# Patient Record
Sex: Male | Born: 1955 | Race: Black or African American | Hispanic: No | Marital: Single | Smoking: Never smoker
Health system: Southern US, Community
[De-identification: ages and names within clinical notes are randomized; demographics above are authoritative.]

## PROBLEM LIST (undated history)

## (undated) DIAGNOSIS — E119 Type 2 diabetes mellitus without complications: Secondary | ICD-10-CM

## (undated) HISTORY — PX: BACK SURGERY: SHX140

---

## 1999-03-13 ENCOUNTER — Emergency Department (HOSPITAL_COMMUNITY): Admission: EM | Admit: 1999-03-13 | Discharge: 1999-03-13 | Payer: Self-pay | Admitting: Emergency Medicine

## 1999-03-13 ENCOUNTER — Encounter: Payer: Self-pay | Admitting: Emergency Medicine

## 1999-05-16 ENCOUNTER — Encounter: Payer: Self-pay | Admitting: Emergency Medicine

## 1999-05-16 ENCOUNTER — Emergency Department (HOSPITAL_COMMUNITY): Admission: EM | Admit: 1999-05-16 | Discharge: 1999-05-16 | Payer: Self-pay | Admitting: Emergency Medicine

## 2000-06-12 ENCOUNTER — Emergency Department (HOSPITAL_COMMUNITY): Admission: EM | Admit: 2000-06-12 | Discharge: 2000-06-12 | Payer: Self-pay | Admitting: Emergency Medicine

## 2000-06-12 ENCOUNTER — Encounter: Payer: Self-pay | Admitting: Emergency Medicine

## 2012-02-05 ENCOUNTER — Encounter (HOSPITAL_COMMUNITY): Payer: Self-pay | Admitting: *Deleted

## 2012-02-05 ENCOUNTER — Emergency Department (HOSPITAL_COMMUNITY)
Admission: EM | Admit: 2012-02-05 | Discharge: 2012-02-06 | Disposition: A | Discharge status: Home / Self Care | Payer: No Typology Code available for payment source | Attending: Emergency Medicine | Admitting: Emergency Medicine

## 2012-02-05 DIAGNOSIS — M545 Low back pain, unspecified: Secondary | ICD-10-CM | POA: Insufficient documentation

## 2012-02-05 DIAGNOSIS — T1490XA Injury, unspecified, initial encounter: Secondary | ICD-10-CM | POA: Insufficient documentation

## 2012-02-05 DIAGNOSIS — M542 Cervicalgia: Secondary | ICD-10-CM | POA: Insufficient documentation

## 2012-02-05 DIAGNOSIS — M549 Dorsalgia, unspecified: Secondary | ICD-10-CM | POA: Insufficient documentation

## 2012-02-05 DIAGNOSIS — Y92410 Unspecified street and highway as the place of occurrence of the external cause: Secondary | ICD-10-CM | POA: Insufficient documentation
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
## 2012-02-06 ENCOUNTER — Emergency Department (HOSPITAL_COMMUNITY): Payer: No Typology Code available for payment source

## 2012-02-06 ENCOUNTER — Encounter (HOSPITAL_COMMUNITY): Payer: Self-pay | Admitting: Emergency Medicine

## 2012-02-06 MED ORDER — HYDROCODONE-ACETAMINOPHEN 5-325 MG PO TABS: 2.0000 | ORAL_TABLET | ORAL | Status: AC | PRN

## 2012-02-06 MED ORDER — IBUPROFEN 800 MG PO TABS
800.0000 mg | ORAL_TABLET | Freq: Once | ORAL | Status: AC
  Administered 2012-02-06: 800 mg via ORAL

## 2012-02-06 MED ORDER — IBUPROFEN 800 MG PO TABS
ORAL_TABLET | ORAL | Status: AC
  Filled 2012-02-06: qty 1

## 2012-02-06 MED ORDER — DIAZEPAM 5 MG PO TABS: 5.0000 mg | ORAL_TABLET | Freq: Two times a day (BID) | ORAL | Status: AC
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
## 2012-02-06 NOTE — ED Notes (Signed)
Patient is alert and oriented x3.  He was given DC instructions and follow up visit instructions.  Patient gave verbal understanding.  He was DC ambulatory under his own power to home.  V/S stable.  He was not showing any signs of distress on DC 
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
## 2012-02-09 NOTE — ED Provider Notes (Signed)
Medical screening examination/treatment/procedure(s) were performed by non-physician practitioner and as supervising physician I was immediately available for consultation/collaboration.  Kimmerly Lora K Linker, MD 02/09/12 0656 

## 2014-08-04 DIAGNOSIS — S79912A Unspecified injury of left hip, initial encounter: Secondary | ICD-10-CM | POA: Diagnosis not present

## 2014-08-04 DIAGNOSIS — Y9389 Activity, other specified: Secondary | ICD-10-CM | POA: Insufficient documentation

## 2014-08-04 DIAGNOSIS — E119 Type 2 diabetes mellitus without complications: Secondary | ICD-10-CM | POA: Diagnosis not present

## 2014-08-04 DIAGNOSIS — Y92410 Unspecified street and highway as the place of occurrence of the external cause: Secondary | ICD-10-CM | POA: Insufficient documentation

## 2014-08-05 ENCOUNTER — Emergency Department (HOSPITAL_COMMUNITY)
Admission: EM | Admit: 2014-08-05 | Discharge: 2014-08-05 | Disposition: A | Discharge status: Home / Self Care | Payer: No Typology Code available for payment source | Attending: Emergency Medicine | Admitting: Emergency Medicine

## 2014-08-05 ENCOUNTER — Encounter (HOSPITAL_COMMUNITY): Payer: Self-pay | Admitting: Emergency Medicine

## 2014-08-05 ENCOUNTER — Emergency Department (HOSPITAL_COMMUNITY): Payer: No Typology Code available for payment source

## 2014-08-05 DIAGNOSIS — M25552 Pain in left hip: Secondary | ICD-10-CM

## 2014-08-05 HISTORY — DX: Type 2 diabetes mellitus without complications: E11.9

## 2014-08-05 MED ORDER — CYCLOBENZAPRINE HCL 10 MG PO TABS: 10.0000 mg | ORAL_TABLET | Freq: Two times a day (BID) | ORAL | Status: AC | PRN

## 2014-08-05 MED ORDER — OXYCODONE-ACETAMINOPHEN 5-325 MG PO TABS
1.0000 | ORAL_TABLET | Freq: Once | ORAL | Status: AC
  Administered 2014-08-05: 1 via ORAL
  Filled 2014-08-05: qty 1

## 2014-08-05 MED ORDER — NAPROXEN 375 MG PO TABS: 375.0000 mg | ORAL_TABLET | Freq: Two times a day (BID) | ORAL | Status: AC

## 2014-08-05 MED ORDER — OXYCODONE-ACETAMINOPHEN 5-325 MG PO TABS: 1.0000 | ORAL_TABLET | Freq: Four times a day (QID) | ORAL | Status: DC | PRN

## 2014-08-05 MED ORDER — ONDANSETRON 4 MG PO TBDP
4.0000 mg | ORAL_TABLET | Freq: Once | ORAL | Status: AC
  Administered 2014-08-05: 4 mg via ORAL
  Filled 2014-08-05: qty 1
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
## 2014-08-05 NOTE — ED Notes (Signed)
Pt reports his friends will drive him home

## 2014-08-08 NOTE — ED Provider Notes (Signed)
Medical screening examination/treatment/procedure(s) were performed by non-physician practitioner and as supervising physician I was immediately available for consultation/collaboration.   EKG Interpretation None       Olivia Mackielga M Kody Brandl, MD 08/08/14 1504
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
## 2024-01-19 ENCOUNTER — Encounter: Payer: Self-pay | Admitting: *Deleted

## 2024-06-11 ENCOUNTER — Emergency Department (HOSPITAL_COMMUNITY)
Admission: EM | Admit: 2024-06-11 | Discharge: 2024-06-12 | Disposition: A | Discharge status: Home / Self Care | Attending: Emergency Medicine | Admitting: Emergency Medicine

## 2024-06-11 ENCOUNTER — Other Ambulatory Visit: Payer: Self-pay

## 2024-06-11 ENCOUNTER — Encounter (HOSPITAL_COMMUNITY): Payer: Self-pay | Admitting: Emergency Medicine

## 2024-06-11 DIAGNOSIS — R42 Dizziness and giddiness: Secondary | ICD-10-CM | POA: Diagnosis present

## 2024-06-11 NOTE — ED Triage Notes (Signed)
 Pt states he stood up and became dizzy and fell back on the bed.

## 2024-06-12 ENCOUNTER — Emergency Department (HOSPITAL_COMMUNITY)

## 2024-06-12 DIAGNOSIS — R42 Dizziness and giddiness: Secondary | ICD-10-CM | POA: Diagnosis not present

## 2024-06-12 LAB — BASIC METABOLIC PANEL WITH GFR
Anion gap: 9 (ref 5–15)
BUN: 15 mg/dL (ref 8–23)
CO2: 23 mmol/L (ref 22–32)
Calcium: 9.3 mg/dL (ref 8.9–10.3)
Chloride: 102 mmol/L (ref 98–111)
Creatinine, Ser: 0.87 mg/dL (ref 0.61–1.24)
GFR, Estimated: >60 mL/min (ref >60)
Glucose, Bld: 217 mg/dL — ABNORMAL HIGH (ref 70–99)
Potassium: 4.4 mmol/L (ref 3.5–5.1)
Sodium: 134 mmol/L — ABNORMAL LOW (ref 135–145)

## 2024-06-12 LAB — CBC WITH DIFFERENTIAL/PLATELET
Abs Immature Granulocytes: 0.03 K/uL (ref 0.00–0.07)
Basophils Absolute: 0 K/uL (ref 0.0–0.1)
Basophils Relative: 0 %
Eosinophils Absolute: 0.3 K/uL (ref 0.0–0.5)
Eosinophils Relative: 3 %
HCT: 40.4 % (ref 39.0–52.0)
Hemoglobin: 13.5 g/dL (ref 13.0–17.0)
Immature Granulocytes: 0 %
Lymphocytes Relative: 36 %
Lymphs Abs: 3.3 K/uL (ref 0.7–4.0)
MCH: 29.8 pg (ref 26.0–34.0)
MCHC: 33.4 g/dL (ref 30.0–36.0)
MCV: 89.2 fL (ref 80.0–100.0)
Monocytes Absolute: 0.8 K/uL (ref 0.1–1.0)
Monocytes Relative: 9 %
Neutro Abs: 4.6 K/uL (ref 1.7–7.7)
Neutrophils Relative %: 52 %
Platelets: 198 K/uL (ref 150–400)
RBC: 4.53 MIL/uL (ref 4.22–5.81)
RDW: 13.6 % (ref 11.5–15.5)
WBC: 9.1 K/uL (ref 4.0–10.5)
nRBC: 0 % (ref 0.0–0.2)

## 2024-06-12 LAB — URINALYSIS, ROUTINE W REFLEX MICROSCOPIC
Bilirubin Urine: NEGATIVE
Glucose, UA: 150 mg/dL — AB
Hgb urine dipstick: NEGATIVE
Ketones, ur: NEGATIVE mg/dL
Leukocytes,Ua: NEGATIVE
Nitrite: NEGATIVE
Protein, ur: NEGATIVE mg/dL
Specific Gravity, Urine: 1.014 (ref 1.005–1.030)
pH: 6 (ref 5.0–8.0)

## 2024-06-12 NOTE — ED Provider Notes (Signed)
 [place] EMERGENCY DEPARTMENT AT [facility] Provider Note   CSN: [id number] Arrival date & time: 06/11/24  2237     Patient presents with: Dizziness   Joshua Santana is a 68 y.o. male.   Presents to the emergency department for evaluation of dizziness.  Patient reports that he had an episode of dizziness after standing tonight.  Reports that it caused him to fall back into the bed.  He did not lose consciousness.  He reports that he is a diabetic, was concerned about the symptoms.  He does, however, reports that currently he is feeling back to his baseline without any further dizziness.       Prior to Admission medications   Medication Sig Start Date End Date Taking? Authorizing Provider  cyclobenzaprine  (FLEXERIL ) 10 MG tablet Take 1 tablet (10 mg total) by mouth 2 (two) times daily as needed for muscle spasms. 08/05/14   Levora Riggs, PA-C  ibuprofen  (ADVIL ,MOTRIN ) 200 MG tablet Take 200 mg by mouth every 6 (six) hours as needed. Pain    [provider]  naproxen  (NAPROSYN ) 375 MG tablet Take 1 tablet (375 mg total) by mouth 2 (two) times daily. 08/05/14   Levora Riggs, PA-C  oxyCODONE -acetaminophen  (PERCOCET/ROXICET) 5-325 MG per tablet Take 1-2 tablets by mouth every 6 (six) hours as needed. 08/05/14   Levora Riggs, PA-C    Allergies: Patient has no known allergies.    Review of Systems  Updated Vital Signs BP (!) 158/93   Pulse 70   Temp 97.8 F (36.6 C) (Oral)   Resp 17   Ht 5' 11 (1.803 m)   Wt 88.5 kg   SpO2 100%   BMI 27.21 kg/m   Physical Exam Vitals and nursing note reviewed.  Constitutional:      General: He is not in acute distress.    Appearance: He is well-developed.  HENT:     Head: Normocephalic and atraumatic.     Mouth/Throat:     Mouth: Mucous membranes are moist.  Eyes:     General: Vision grossly intact. Gaze aligned appropriately.     Extraocular Movements: Extraocular movements intact.     Conjunctiva/sclera:  Conjunctivae normal.  Cardiovascular:     Rate and Rhythm: Normal rate and regular rhythm.     Pulses: Normal pulses.     Heart sounds: Normal heart sounds, S1 normal and S2 normal. No murmur heard.    No friction rub. No gallop.  Pulmonary:     Effort: Pulmonary effort is normal. No respiratory distress.     Breath sounds: Normal breath sounds.  Abdominal:     Palpations: Abdomen is soft.     Tenderness: There is no abdominal tenderness. There is no guarding or rebound.     Hernia: No hernia is present.  Musculoskeletal:        General: No swelling.     Cervical back: Full passive range of motion without pain, normal range of motion and neck supple. No pain with movement, spinous process tenderness or muscular tenderness. Normal range of motion.     Right lower leg: No edema.     Left lower leg: No edema.  Skin:    General: Skin is warm and dry.     Capillary Refill: Capillary refill takes less than 2 seconds.     Findings: No ecchymosis, erythema, lesion or wound.  Neurological:     Mental Status: He is alert and oriented to person, place, and time.  GCS: GCS eye subscore is 4. GCS verbal subscore is 5. GCS motor subscore is 6.     Cranial Nerves: Cranial nerves 2-12 are intact.     Sensory: Sensation is intact.     Motor: Motor function is intact. No weakness or abnormal muscle tone.     Coordination: Coordination is intact.  Psychiatric:        Mood and Affect: Mood normal.        Speech: Speech normal.        Behavior: Behavior normal.     (all labs ordered are listed, but only abnormal results are displayed) Labs Reviewed  BASIC METABOLIC PANEL WITH GFR - Abnormal; Notable for the following components:      Result Value   Sodium 134 (*)    Glucose, Bld 217 (*)    All other components within normal limits  CBC WITH DIFFERENTIAL/PLATELET  URINALYSIS, ROUTINE W REFLEX MICROSCOPIC    EKG: None  Radiology: No results found.   Procedures   Medications  Ordered in the ED - No data to display                                  Medical Decision Making Amount and/or Complexity of Data Reviewed Labs: ordered. Radiology: ordered.   Presents with concerns over dizziness.  Patient reports that he felt dizzy when he stood up and it caused him to fall backwards onto his bed.  No loss of consciousness.  No injury.  Patient appears well at arrival.  Workup has been reassuring.  Blood work is normal.  CT head without acute findings.  Neurologic exam is normal and his symptoms have not reoccurred.  Patient reassured, no further workup necessary.     Final diagnoses:  Dizziness    ED Discharge Orders     None          Sadiya Durand, Lonni PARAS, MD 06/12/24 [phone]

## 2024-06-15 ENCOUNTER — Emergency Department (HOSPITAL_COMMUNITY)
Admission: EM | Admit: 2024-06-15 | Discharge: 2024-06-16 | Disposition: A | Discharge status: Home / Self Care | Attending: Emergency Medicine | Admitting: Emergency Medicine

## 2024-06-15 ENCOUNTER — Encounter (HOSPITAL_COMMUNITY): Payer: Self-pay | Admitting: Emergency Medicine

## 2024-06-15 ENCOUNTER — Emergency Department (HOSPITAL_COMMUNITY)

## 2024-06-15 ENCOUNTER — Other Ambulatory Visit: Payer: Self-pay

## 2024-06-15 DIAGNOSIS — E1165 Type 2 diabetes mellitus with hyperglycemia: Secondary | ICD-10-CM | POA: Diagnosis not present

## 2024-06-15 DIAGNOSIS — D72829 Elevated white blood cell count, unspecified: Secondary | ICD-10-CM | POA: Diagnosis not present

## 2024-06-15 DIAGNOSIS — N132 Hydronephrosis with renal and ureteral calculous obstruction: Secondary | ICD-10-CM | POA: Diagnosis not present

## 2024-06-15 DIAGNOSIS — N201 Calculus of ureter: Secondary | ICD-10-CM

## 2024-06-15 DIAGNOSIS — R1030 Lower abdominal pain, unspecified: Secondary | ICD-10-CM | POA: Diagnosis present

## 2024-06-15 LAB — URINALYSIS, ROUTINE W REFLEX MICROSCOPIC
Bacteria, UA: NONE SEEN
Bilirubin Urine: NEGATIVE
Glucose, UA: 150 mg/dL — AB
Ketones, ur: NEGATIVE mg/dL
Leukocytes,Ua: NEGATIVE
Nitrite: NEGATIVE
Protein, ur: 30 mg/dL — AB
Specific Gravity, Urine: 1.02 (ref 1.005–1.030)
pH: 5 (ref 5.0–8.0)

## 2024-06-15 LAB — COMPREHENSIVE METABOLIC PANEL WITH GFR
ALT: 19 U/L (ref 0–44)
AST: 21 U/L (ref 15–41)
Albumin: 4.3 g/dL (ref 3.5–5.0)
Alkaline Phosphatase: 111 U/L (ref 38–126)
Anion gap: 9 (ref 5–15)
BUN: 15 mg/dL (ref 8–23)
CO2: 21 mmol/L — ABNORMAL LOW (ref 22–32)
Calcium: 9.7 mg/dL (ref 8.9–10.3)
Chloride: 106 mmol/L (ref 98–111)
Creatinine, Ser: 1 mg/dL (ref 0.61–1.24)
GFR, Estimated: >60 mL/min (ref >60)
Glucose, Bld: 233 mg/dL — ABNORMAL HIGH (ref 70–99)
Potassium: 4.2 mmol/L (ref 3.5–5.1)
Sodium: 136 mmol/L (ref 135–145)
Total Bilirubin: 1.5 mg/dL — ABNORMAL HIGH (ref 0.0–1.2)
Total Protein: 8.3 g/dL — ABNORMAL HIGH (ref 6.5–8.1)

## 2024-06-15 LAB — CBC
HCT: 46.1 % (ref 39.0–52.0)
Hemoglobin: 15.1 g/dL (ref 13.0–17.0)
MCH: 29.4 pg (ref 26.0–34.0)
MCHC: 32.8 g/dL (ref 30.0–36.0)
MCV: 89.7 fL (ref 80.0–100.0)
Platelets: 233 K/uL (ref 150–400)
RBC: 5.14 MIL/uL (ref 4.22–5.81)
RDW: 13.6 % (ref 11.5–15.5)
WBC: 12.6 K/uL — ABNORMAL HIGH (ref 4.0–10.5)
nRBC: 0 % (ref 0.0–0.2)

## 2024-06-15 LAB — LIPASE, BLOOD: Lipase: 33 U/L (ref 11–51)

## 2024-06-15 MED ORDER — ONDANSETRON HCL 4 MG/2ML IJ SOLN
4.0000 mg | Freq: Once | INTRAMUSCULAR | Status: AC
  Administered 2024-06-15: 4 mg via INTRAVENOUS
  Filled 2024-06-15: qty 2

## 2024-06-15 MED ORDER — FENTANYL CITRATE PF 50 MCG/ML IJ SOSY
25.0000 ug | PREFILLED_SYRINGE | Freq: Once | INTRAMUSCULAR | Status: AC
  Administered 2024-06-15: 25 ug via INTRAVENOUS
  Filled 2024-06-15: qty 1

## 2024-06-15 MED ORDER — MORPHINE SULFATE (PF) 2 MG/ML IV SOLN
4.0000 mg | Freq: Once | INTRAVENOUS | Status: AC
  Administered 2024-06-15: 4 mg via INTRAVENOUS
  Filled 2024-06-15: qty 2

## 2024-06-15 MED ORDER — IOHEXOL 350 MG/ML SOLN
75.0000 mL | Freq: Once | INTRAVENOUS | Status: AC | PRN
  Administered 2024-06-15: 75 mL via INTRAVENOUS

## 2024-06-15 NOTE — ED Triage Notes (Signed)
 C/O left sided abd pain that started last night. C/O n/v. Denies fevers and diarrhea.

## 2024-06-15 NOTE — ED Triage Notes (Signed)
 Pt in with dull LLQ pain that began after eating pizza last night. Reports a few episodes of emesis since, denies any diarrhea. Pt reports dizziness that has been ongoing x 4 days, but worsened today.

## 2024-06-15 NOTE — ED Notes (Signed)
 Patient transported to CT

## 2024-06-15 NOTE — ED Provider Triage Note (Signed)
 Emergency Medicine Provider Triage Evaluation Note  Joshua Santana , a 68 y.o. male  was evaluated in triage.  Pt complains of abdominal pain.  Patient reports that he is been having left lower quadrant abdominal pain since last night.  Multiple episodes of vomiting but no diarrhea.  No reported fevers.  Does endorse some ongoing dizziness for the last 4 days that is worsening.  No reported hematemesis, hematochezia, or melanotic stools.  Review of Systems  Positive: As above Negative: As above  Physical Exam  BP (!) 185/98 (BP Location: Right Arm)   Pulse 84   Temp 97.9 F (36.6 C) (Oral)   Resp 17   Wt 88.5 kg   SpO2 98%   BMI 27.21 kg/m  Gen:   Awake, no distress   Resp:  Normal effort  MSK:   Moves extremities without difficulty  Other:   Left sided abdominal tenderness with guarding present  Medical Decision Making  Medically screening exam initiated at 7:55 PM.  Appropriate orders placed.  Joshua Santana was informed that the remainder of the evaluation will be completed by another provider, this initial triage assessment does not replace that evaluation, and the importance of remaining in the ED until their evaluation is complete.     Arvilla Salada A, PA-C 06/15/24 1956

## 2024-06-16 DIAGNOSIS — N132 Hydronephrosis with renal and ureteral calculous obstruction: Secondary | ICD-10-CM | POA: Diagnosis not present

## 2024-06-16 MED ORDER — OXYCODONE-ACETAMINOPHEN 5-325 MG PO TABS: 1.0000 | ORAL_TABLET | Freq: Four times a day (QID) | ORAL | 0 refills | Status: AC | PRN

## 2024-06-16 MED ORDER — ONDANSETRON 4 MG PO TBDP: 4.0000 mg | ORAL_TABLET | Freq: Three times a day (TID) | ORAL | 0 refills | Status: AC | PRN

## 2024-06-16 MED ORDER — KETOROLAC TROMETHAMINE 15 MG/ML IJ SOLN
15.0000 mg | Freq: Once | INTRAMUSCULAR | Status: AC
  Administered 2024-06-16: 15 mg via INTRAVENOUS
  Filled 2024-06-16: qty 1

## 2024-06-16 MED ORDER — TAMSULOSIN HCL 0.4 MG PO CAPS: 0.4000 mg | ORAL_CAPSULE | Freq: Every day | ORAL | 0 refills | Status: AC

## 2024-06-16 MED ORDER — ONDANSETRON HCL 4 MG/2ML IJ SOLN
4.0000 mg | Freq: Once | INTRAMUSCULAR | Status: AC
  Administered 2024-06-16: 4 mg via INTRAVENOUS
  Filled 2024-06-16: qty 2

## 2024-06-16 NOTE — Discharge Instructions (Signed)
 You have a kidney stone on the Left side that is likely causing your pain. You may take the prescribed medication to help with pain and nausea while you are passing your stone. Return to the ER with any new severe symptoms.

## 2024-06-16 NOTE — ED Provider Notes (Signed)
 [place] EMERGENCY DEPARTMENT AT [facility] Provider Note   CSN: [id number] Arrival date & time: 06/15/24  1753     Patient presents with: Abdominal Pain and Dizziness   Joshua Santana is a 68 y.o. male who presents with concern for 4 days of lower abdominal pain worse in the left flank and left back significantly today, urinary frequency but no dysuria, question of hematuria today.  Did have a few episodes of NBNB emesis when pain was most severe today.  No history of same.  Does have history of diabetes without complication per patient , Endorses compliance with his metformin.  Takes ibuprofen  as needed for discomfort.  No anticoagulation.   HPI     Prior to Admission medications   Medication Sig Start Date End Date Taking? Authorizing Provider  ondansetron  (ZOFRAN -ODT) 4 MG disintegrating tablet Take 1 tablet (4 mg total) by mouth every 8 (eight) hours as needed for nausea or vomiting. 06/16/24  Yes Jemal Miskell R, PA-C  oxyCODONE -acetaminophen  (PERCOCET/ROXICET) 5-325 MG tablet Take 1 tablet by mouth every 6 (six) hours as needed for severe pain (pain score 7-10). 06/16/24  Yes Cadel Stairs R, PA-C  tamsulosin  (FLOMAX ) 0.4 MG CAPS capsule Take 1 capsule (0.4 mg total) by mouth daily. 06/16/24  Yes Lamica Mccart, Pleasant SAUNDERS, PA-C  cyclobenzaprine  (FLEXERIL ) 10 MG tablet Take 1 tablet (10 mg total) by mouth 2 (two) times daily as needed for muscle spasms. 08/05/14   Levora Riggs, PA-C  ibuprofen  (ADVIL ,MOTRIN ) 200 MG tablet Take 200 mg by mouth every 6 (six) hours as needed. Pain    [provider]  naproxen  (NAPROSYN ) 375 MG tablet Take 1 tablet (375 mg total) by mouth 2 (two) times daily. 08/05/14   Levora Riggs, PA-C    Allergies: Patient has no known allergies.    Review of Systems  Gastrointestinal:  Positive for abdominal pain and nausea. Negative for vomiting.  Genitourinary:  Positive for flank pain and urgency. Negative for dysuria, hematuria,  penile discharge, penile pain, penile swelling, scrotal swelling and testicular pain.    Updated Vital Signs BP (!) 158/94 (BP Location: Right Arm)   Pulse 76   Temp 97.9 F (36.6 C)   Resp (!) 22   Wt 88.5 kg   SpO2 99%   BMI 27.21 kg/m   Physical Exam Vitals and nursing note reviewed.  Constitutional:      Appearance: He is not ill-appearing or toxic-appearing.  HENT:     Head: Normocephalic and atraumatic.     Mouth/Throat:     Mouth: Mucous membranes are moist.     Pharynx: No oropharyngeal exudate or posterior oropharyngeal erythema.  Eyes:     General:        Right eye: No discharge.        Left eye: No discharge.     Conjunctiva/sclera: Conjunctivae normal.  Cardiovascular:     Rate and Rhythm: Normal rate and regular rhythm.     Pulses: Normal pulses.     Heart sounds: Normal heart sounds. No murmur heard. Pulmonary:     Effort: Pulmonary effort is normal. No respiratory distress.     Breath sounds: Normal breath sounds. No wheezing or rales.  Abdominal:     General: Bowel sounds are normal. There is no distension.     Palpations: Abdomen is soft.     Tenderness: There is abdominal tenderness in the left lower quadrant. There is left CVA tenderness. There is no right CVA tenderness, guarding or  rebound.  Musculoskeletal:        General: No deformity.     Cervical back: Neck supple.  Skin:    General: Skin is warm and dry.  Neurological:     Mental Status: He is alert. Mental status is at baseline.  Psychiatric:        Mood and Affect: Mood normal.     (all labs ordered are listed, but only abnormal results are displayed) Labs Reviewed  COMPREHENSIVE METABOLIC PANEL WITH GFR - Abnormal; Notable for the following components:      Result Value   CO2 21 (*)    Glucose, Bld 233 (*)    Total Protein 8.3 (*)    Total Bilirubin 1.5 (*)    All other components within normal limits  CBC - Abnormal; Notable for the following components:   WBC 12.6 (*)    All  other components within normal limits  URINALYSIS, ROUTINE W REFLEX MICROSCOPIC - Abnormal; Notable for the following components:   APPearance HAZY (*)    Glucose, UA 150 (*)    Hgb urine dipstick MODERATE (*)    Protein, ur 30 (*)    All other components within normal limits  LIPASE, BLOOD    EKG: None  Radiology: CT ABDOMEN PELVIS W CONTRAST Result Date: 06/15/2024 CLINICAL DATA:  Left lower quadrant pain EXAM: CT ABDOMEN AND PELVIS WITH CONTRAST TECHNIQUE: Multidetector CT imaging of the abdomen and pelvis was performed using the standard protocol following bolus administration of intravenous contrast. RADIATION DOSE REDUCTION: This exam was performed according to the departmental dose-optimization program which includes automated exposure control, adjustment of the mA and/or kV according to patient size and/or use of iterative reconstruction technique. CONTRAST:  75mL OMNIPAQUE  IOHEXOL  350 MG/ML SOLN COMPARISON:  None Available. FINDINGS: Lower chest: No acute findings Hepatobiliary: No focal hepatic abnormality. Gallbladder unremarkable. Pancreas: No focal abnormality or ductal dilatation. Spleen: No focal abnormality.  Normal size. Adrenals/Urinary Tract: Mild left hydronephrosis due to 5 mm proximal left ureteral stone. No stones or hydronephrosis on the right. Adrenal glands and urinary bladder unremarkable. Stomach/Bowel: Normal appendix. Stomach, large and small bowel grossly unremarkable. Vascular/Lymphatic: No evidence of aneurysm or adenopathy. Reproductive: No visible focal abnormality. Other: No free fluid or free air. Musculoskeletal: Prior left hip replacement. No acute bony abnormality. IMPRESSION: 5 mm proximal left ureteral stone with mild left hydronephrosis. Electronically Signed   By: Franky Crease M.D.   On: 06/15/2024 22:05     Procedures   Medications Ordered in the ED  fentaNYL  (SUBLIMAZE ) injection 25 mcg (25 mcg Intravenous Given 06/15/24 1957)  ondansetron  (ZOFRAN )  injection 4 mg (4 mg Intravenous Given 06/15/24 1957)  morphine  (PF) 2 MG/ML injection 4 mg (4 mg Intravenous Given 06/15/24 2100)  iohexol  (OMNIPAQUE ) 350 MG/ML injection 75 mL (75 mLs Intravenous Contrast Given 06/15/24 2113)  ketorolac  (TORADOL ) 15 MG/ML injection 15 mg (15 mg Intravenous Given 06/16/24 0500)  ondansetron  (ZOFRAN ) injection 4 mg (4 mg Intravenous Given 06/16/24 0500)                                    Medical Decision Making 68 year old male with left-sided flank pain nausea.  Hypertensive on intake, vitals otherwise normal.  Cardiopulmonary is unremarkable, abdominal exam is left lower quadrant tenderness to palpation.  Mild left-sided CVAT.  The differential diagnosis of emergent flank pain includes, but is not limited to: Nephrolithiasis/ Renal Colic,  Pyelonephritis, Abdominal aortic aneurysm, Aortic dissection, Renal artery embolism, Renal vein thrombosis, Renal infarction, Renal hemorrhage, Mesenteric ischemia, Bladder tumor, Cystitis, Biliary colic, Pancreatitis, Perforated peptic ulcer,  Appendicitis, Inguinal Hernia, Diverticulitis, Bowel obstruction. Shingles, Lower lobe pneumonia, Retroperitoneal hematoma/abscess/tumor, Epidural abscess, Epidural hematoma.  In males, it is important to consider (testicular torsion, Epididymitis, STD)    Amount and/or Complexity of Data Reviewed Labs: ordered.    Details: CBC with mild leukocytosis of 12.6, no anemia, CMP with hyperglycemia of 233.  Mildly elevated total bili 1.5.  UA with moderate hemoglobin but no evidence of infection Radiology:     Details:   CT renal study with 5 mm left proximal ureteral stone with mild left hydro.  ECG/medicine tests:     Details:  EKG with normal sinus rhythm.  Risk Prescription drug management.   Clinical patient most consistent with ureterolithiasis, will discharge with supportive care and outpatient follow-up.  Strict return precautions were given.  Clinical concern for emergent  underlying condition of this patient symptoms that would warrant further ED workup and patient management is exceedingly low.  Priscilla voiced understanding of his medical evaluation and treatment plan. Each of their questions answered to their expressed satisfaction.  Return precautions were given.  Patient is well-appearing, stable, and was discharged in good condition.  This chart was dictated using voice recognition software, Dragon. Despite the best efforts of this provider to proofread and correct errors, errors may still occur which can change documentation meaning.      Final diagnoses:  Ureterolithiasis    ED Discharge Orders          Ordered    ondansetron  (ZOFRAN -ODT) 4 MG disintegrating tablet  Every 8 hours PRN        06/16/24 0436    oxyCODONE -acetaminophen  (PERCOCET/ROXICET) 5-325 MG tablet  Every 6 hours PRN        06/16/24 0436    tamsulosin  (FLOMAX ) 0.4 MG CAPS capsule  Daily        06/16/24 0436               Arihanna Estabrook, Pleasant SAUNDERS, PA-C 06/16/24 0548    Palumbo, April, MD 06/16/24 9444

## 2024-07-24 ENCOUNTER — Encounter: Payer: Self-pay | Admitting: *Deleted
